# Patient Record
Sex: Female | Born: 1945 | Race: White | Hispanic: No | Marital: Married | Smoking: Never smoker
Health system: Southern US, Community
[De-identification: ages and names within clinical notes are randomized; demographics above are authoritative.]

## PROBLEM LIST (undated history)

## (undated) DIAGNOSIS — I10 Essential (primary) hypertension: Secondary | ICD-10-CM

## (undated) DIAGNOSIS — K219 Gastro-esophageal reflux disease without esophagitis: Secondary | ICD-10-CM

## (undated) DIAGNOSIS — E079 Disorder of thyroid, unspecified: Secondary | ICD-10-CM

## (undated) DIAGNOSIS — F329 Major depressive disorder, single episode, unspecified: Secondary | ICD-10-CM

## (undated) DIAGNOSIS — F32A Depression, unspecified: Secondary | ICD-10-CM

## (undated) DIAGNOSIS — R32 Unspecified urinary incontinence: Secondary | ICD-10-CM

## (undated) HISTORY — PX: PAROTIDECTOMY: SUR1003

## (undated) HISTORY — PX: ABDOMINAL HYSTERECTOMY: SHX81

---

## 2015-02-01 ENCOUNTER — Encounter (HOSPITAL_BASED_OUTPATIENT_CLINIC_OR_DEPARTMENT_OTHER): Payer: Self-pay | Admitting: *Deleted

## 2015-02-01 ENCOUNTER — Emergency Department (HOSPITAL_BASED_OUTPATIENT_CLINIC_OR_DEPARTMENT_OTHER): Payer: BLUE CROSS/BLUE SHIELD

## 2015-02-01 ENCOUNTER — Emergency Department (HOSPITAL_BASED_OUTPATIENT_CLINIC_OR_DEPARTMENT_OTHER)
Admission: EM | Admit: 2015-02-01 | Discharge: 2015-02-01 | Disposition: A | Discharge status: Home / Self Care | Payer: BLUE CROSS/BLUE SHIELD | Attending: Emergency Medicine | Admitting: Emergency Medicine

## 2015-02-01 DIAGNOSIS — Z8719 Personal history of other diseases of the digestive system: Secondary | ICD-10-CM | POA: Diagnosis not present

## 2015-02-01 DIAGNOSIS — Y998 Other external cause status: Secondary | ICD-10-CM | POA: Diagnosis not present

## 2015-02-01 DIAGNOSIS — Z79899 Other long term (current) drug therapy: Secondary | ICD-10-CM | POA: Insufficient documentation

## 2015-02-01 DIAGNOSIS — Z7982 Long term (current) use of aspirin: Secondary | ICD-10-CM | POA: Insufficient documentation

## 2015-02-01 DIAGNOSIS — Y9289 Other specified places as the place of occurrence of the external cause: Secondary | ICD-10-CM | POA: Insufficient documentation

## 2015-02-01 DIAGNOSIS — F329 Major depressive disorder, single episode, unspecified: Secondary | ICD-10-CM | POA: Diagnosis not present

## 2015-02-01 DIAGNOSIS — Y9389 Activity, other specified: Secondary | ICD-10-CM | POA: Diagnosis not present

## 2015-02-01 DIAGNOSIS — E079 Disorder of thyroid, unspecified: Secondary | ICD-10-CM | POA: Diagnosis not present

## 2015-02-01 DIAGNOSIS — I10 Essential (primary) hypertension: Secondary | ICD-10-CM | POA: Insufficient documentation

## 2015-02-01 DIAGNOSIS — S4991XA Unspecified injury of right shoulder and upper arm, initial encounter: Secondary | ICD-10-CM | POA: Diagnosis present

## 2015-02-01 DIAGNOSIS — S42291A Other displaced fracture of upper end of right humerus, initial encounter for closed fracture: Secondary | ICD-10-CM | POA: Insufficient documentation

## 2015-02-01 DIAGNOSIS — W010XXA Fall on same level from slipping, tripping and stumbling without subsequent striking against object, initial encounter: Secondary | ICD-10-CM | POA: Insufficient documentation

## 2015-02-01 HISTORY — DX: Gastro-esophageal reflux disease without esophagitis: K21.9

## 2015-02-01 HISTORY — DX: Essential (primary) hypertension: I10

## 2015-02-01 HISTORY — DX: Depression, unspecified: F32.A

## 2015-02-01 HISTORY — DX: Disorder of thyroid, unspecified: E07.9

## 2015-02-01 HISTORY — DX: Major depressive disorder, single episode, unspecified: F32.9

## 2015-02-01 HISTORY — DX: Unspecified urinary incontinence: R32

## 2015-02-01 NOTE — ED Provider Notes (Addendum)
CSN: [id number]     Arrival date & time 02/01/15  2143 History  This chart was scribed for Paula Libra, MD by Richarda Overlie, ED Scribe. This patient was seen in room MH10/MH10 and the patient's care was started 11:00 PM.   Chief Complaint  Patient presents with  . Arm Injury   The history is provided by the patient. No language interpreter was used.   HPI Comments: Tammy Stokes is a 69 y.o. female who presents to the Emergency Department complaining of right shoulder pain following a fall at approximately 9PM tonight. Pt states she tripped and fell over a PVC pipe, landing on her right upper arm. She denies head trauma or LOC. Pt complains of right shoulder and right upper arm pain at this time and says that her shoulder pain is more severe. She rates her pain as a 4/10 at this time. Pt states that if she were to move her arm her pain would be a 6/10. She denies neck pain or any numbness or weakness in her right upper extremity distally.   Past Medical History  Diagnosis Date  . Hypertension   . GERD (gastroesophageal reflux disease)   . Thyroid disease   . Depression   . Urinary incontinence    Past Surgical History  Procedure Laterality Date  . Abdominal hysterectomy    . Parotidectomy     No family history on file. History  Substance Use Topics  . Smoking status: Never Smoker   . Smokeless tobacco: Not on file  . Alcohol Use: No   OB History    No data available     Review of Systems  Musculoskeletal: Positive for arthralgias. Negative for neck pain.  Neurological: Negative for numbness.  All other systems reviewed and are negative.   Allergies  Review of patient's allergies indicates no known allergies.  Home Medications   Prior to Admission medications   Medication Sig Start Date End Date Taking? Authorizing Provider  Aspirin (ASPIR-81 PO) Take by mouth.   Yes Historical Provider, MD  Atorvastatin Calcium (LIPITOR PO) Take by mouth.   Yes Historical Provider, MD   Darifenacin Hydrobromide (ENABLEX PO) Take by mouth.   Yes Historical Provider, MD  Levothyroxine Sodium (SYNTHROID PO) Take by mouth.   Yes Historical Provider, MD  OMEPRAZOLE PO Take by mouth.   Yes Historical Provider, MD  OxyCODONE HCl (OXYCONTIN PO) Take by mouth.   Yes Historical Provider, MD  PROPRANOLOL HCL PO Take by mouth.   Yes Historical Provider, MD  Sertraline HCl (ZOLOFT PO) Take by mouth.   Yes Historical Provider, MD   BP 149/98 mmHg  Pulse 87  Resp 18  SpO2 98%   Physical Exam  General: Well-developed, well-nourished female in no acute distress; appearance consistent with age of record HENT: normocephalic; atraumatic Eyes: pupils equal, round and reactive to light; extraocular muscles intact; lens implants Neck: supple, non-tender Heart: regular rate and rhythm Lungs: clear to auscultation bilaterally Abdomen: soft; nondistended; nontender; bowel sounds present Extremities: Tenderness and swelling of right shoulder; no tenderness of right elbow; right upper extremity distally neurovascularly intact; no deformity; full range of motion except right shoulder; pulses normal Neurologic: Awake, alert and oriented; motor function intact in all extremities and symmetric; no facial droop Skin: Warm and dry Psychiatric: Normal mood and affect   ED Course  Procedures     COORDINATION OF CARE: 11:10 PM Discussed treatment plan with pt at bedside and pt agreed to plan.  MDM  Nursing notes and vitals signs, including pulse oximetry, reviewed.  Summary of this visit's results, reviewed by myself:  Imaging Studies: Dg Elbow Complete Right  02/01/2015   CLINICAL DATA:  Tripped and fell. Injury to the right elbow with pain.  EXAM: RIGHT ELBOW - COMPLETE 3+ VIEW  COMPARISON:  Right humerus 02/01/2015  FINDINGS: Lateral view is limited due to its oblique orientation. No evidence for a large joint effusion. Negative for a fracture or dislocation.  IMPRESSION: No acute bone  abnormality to the right elbow.   Electronically Signed   By: Richarda OverlieAdam  Henn M.D.   On: 02/01/2015 22:31   Dg Humerus Right  02/01/2015   CLINICAL DATA:  Tripped and fell. Limited range of motion. Injured RIGHT humerus.  EXAM: RIGHT HUMERUS - 2+ VIEW  COMPARISON:  None.  FINDINGS: There is a minimally displaced fracture across the neck of the humerus, slight comminution medially. Fracture line may extend as far cephalad as the greater tuberosity. There is no glenohumeral dislocation. The bones are demineralized. Soft tissue swelling.  IMPRESSION: Minimally displaced humeral neck fracture.  Osteopenia.   Electronically Signed   By: Davonna BellingJohn  Curnes M.D.   On: 02/01/2015 22:32   The patient states she has an adequate supply of analgesics at home. She is on OxyContin for chronic pain.   Final diagnoses:  Humeral head fracture, right, closed, initial encounter   I personally performed the services described in this documentation, which was scribed in my presence. The recorded information has been reviewed and is accurate.   Paula LibraJohn Daxen Lanum, MD 02/01/15 [id number]  Paula LibraJohn Gwenn Teodoro, MD 02/01/15 [phone]

## 2015-02-01 NOTE — ED Notes (Signed)
Tripped and fell tonight. Pain to her right upper arm and elbow.

## 2016-09-20 ENCOUNTER — Encounter (HOSPITAL_BASED_OUTPATIENT_CLINIC_OR_DEPARTMENT_OTHER): Payer: Self-pay | Admitting: *Deleted

## 2016-09-20 ENCOUNTER — Emergency Department (HOSPITAL_BASED_OUTPATIENT_CLINIC_OR_DEPARTMENT_OTHER)
Admission: EM | Admit: 2016-09-20 | Discharge: 2016-09-20 | Disposition: A | Discharge status: Home / Self Care | Payer: Medicare Other | Attending: Emergency Medicine | Admitting: Emergency Medicine

## 2016-09-20 ENCOUNTER — Emergency Department (HOSPITAL_BASED_OUTPATIENT_CLINIC_OR_DEPARTMENT_OTHER): Payer: Medicare Other

## 2016-09-20 DIAGNOSIS — R112 Nausea with vomiting, unspecified: Secondary | ICD-10-CM | POA: Insufficient documentation

## 2016-09-20 DIAGNOSIS — I10 Essential (primary) hypertension: Secondary | ICD-10-CM | POA: Insufficient documentation

## 2016-09-20 DIAGNOSIS — M7989 Other specified soft tissue disorders: Secondary | ICD-10-CM | POA: Diagnosis not present

## 2016-09-20 DIAGNOSIS — R0981 Nasal congestion: Secondary | ICD-10-CM | POA: Diagnosis not present

## 2016-09-20 DIAGNOSIS — R42 Dizziness and giddiness: Secondary | ICD-10-CM | POA: Diagnosis not present

## 2016-09-20 DIAGNOSIS — M549 Dorsalgia, unspecified: Secondary | ICD-10-CM | POA: Insufficient documentation

## 2016-09-20 DIAGNOSIS — R197 Diarrhea, unspecified: Secondary | ICD-10-CM | POA: Diagnosis not present

## 2016-09-20 DIAGNOSIS — Z79899 Other long term (current) drug therapy: Secondary | ICD-10-CM | POA: Diagnosis not present

## 2016-09-20 DIAGNOSIS — R05 Cough: Secondary | ICD-10-CM | POA: Insufficient documentation

## 2016-09-20 DIAGNOSIS — R509 Fever, unspecified: Secondary | ICD-10-CM | POA: Insufficient documentation

## 2016-09-20 DIAGNOSIS — R6889 Other general symptoms and signs: Secondary | ICD-10-CM

## 2016-09-20 DIAGNOSIS — Z7982 Long term (current) use of aspirin: Secondary | ICD-10-CM | POA: Insufficient documentation

## 2016-09-20 LAB — CBC
HCT: 39 % (ref 36.0–46.0)
Hemoglobin: 13.1 g/dL (ref 12.0–15.0)
MCH: 29.1 pg (ref 26.0–34.0)
MCHC: 33.6 g/dL (ref 30.0–36.0)
MCV: 86.7 fL (ref 78.0–100.0)
Platelets: 126 10*3/uL — ABNORMAL LOW (ref 150–400)
RBC: 4.5 MIL/uL (ref 3.87–5.11)
RDW: 14.6 % (ref 11.5–15.5)
WBC: 3.8 10*3/uL — ABNORMAL LOW (ref 4.0–10.5)

## 2016-09-20 LAB — BASIC METABOLIC PANEL
Anion gap: 8 (ref 5–15)
BUN: 16 mg/dL (ref 6–20)
CALCIUM: 8.9 mg/dL (ref 8.9–10.3)
CHLORIDE: 102 mmol/L (ref 101–111)
CO2: 25 mmol/L (ref 22–32)
CREATININE: 0.75 mg/dL (ref 0.44–1.00)
GFR calc Af Amer: >60 mL/min (ref >60)
GFR calc non Af Amer: >60 mL/min (ref >60)
Glucose, Bld: 119 mg/dL — ABNORMAL HIGH (ref 65–99)
Potassium: 3.5 mmol/L (ref 3.5–5.1)
Sodium: 135 mmol/L (ref 135–145)

## 2016-09-20 MED ORDER — DM-GUAIFENESIN ER 30-600 MG PO TB12: 1.0000 | ORAL_TABLET | Freq: Two times a day (BID) | ORAL | 0 refills | Status: AC

## 2016-09-20 MED ORDER — ACETAMINOPHEN 325 MG PO TABS
650.0000 mg | ORAL_TABLET | Freq: Once | ORAL | Status: AC
  Administered 2016-09-20: 650 mg via ORAL

## 2016-09-20 MED ORDER — OSELTAMIVIR PHOSPHATE 75 MG PO CAPS
75.0000 mg | ORAL_CAPSULE | Freq: Once | ORAL | Status: AC
  Administered 2016-09-20: 75 mg via ORAL
  Filled 2016-09-20: qty 1

## 2016-09-20 MED ORDER — ONDANSETRON 4 MG PO TBDP: 4.0000 mg | ORAL_TABLET | Freq: Three times a day (TID) | ORAL | 0 refills | Status: AC | PRN

## 2016-09-20 MED ORDER — OSELTAMIVIR PHOSPHATE 75 MG PO CAPS: 75.0000 mg | ORAL_CAPSULE | Freq: Two times a day (BID) | ORAL | 0 refills | Status: AC

## 2016-09-20 MED ORDER — SODIUM CHLORIDE 0.9 % IV BOLUS (SEPSIS)
500.0000 mL | Freq: Once | INTRAVENOUS | Status: AC
  Administered 2016-09-20: 500 mL via INTRAVENOUS

## 2016-09-20 MED ORDER — ONDANSETRON HCL 4 MG/2ML IJ SOLN
4.0000 mg | Freq: Once | INTRAMUSCULAR | Status: AC
  Administered 2016-09-20: 4 mg via INTRAVENOUS
  Filled 2016-09-20: qty 2

## 2016-09-20 MED ORDER — SODIUM CHLORIDE 0.9 % IV SOLN
INTRAVENOUS | Status: DC
  Administered 2016-09-20: 21:00:00 via INTRAVENOUS

## 2016-09-20 MED ORDER — ACETAMINOPHEN 325 MG PO TABS
ORAL_TABLET | ORAL | Status: AC
  Filled 2016-09-20: qty 2

## 2016-09-20 NOTE — Discharge Instructions (Signed)
Take the Zofran for the nausea and vomiting. Take the Tamiflu as directed. First dose given here tonight. Return for any new or worse symptoms. If cough and congestion gets worse have a prescription for Mucinex DM. Follow-up with your doctor for recheck.

## 2016-09-20 NOTE — ED Triage Notes (Signed)
Headache, vomiting, fever, dizziness. She started with a cold yesterday.

## 2016-09-20 NOTE — ED Notes (Signed)
Pt. Has vomited x 2 or 3 today and has felt like not eating.  Pt. Reports she has been going to rehab to get her balance back on track due to imbalance problems.  Pt. 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

## 2016-09-20 NOTE — ED Provider Notes (Signed)
[facility] Provider Note   CSN: [id number] Arrival date & time: 09/20/16  1851 By signing my name below, I, Tammy Stokes, attest that this documentation has been prepared under the direction and in the presence of Tammy Mulders, MD . Electronically Signed: Levon Stokes, Scribe. 09/20/2016. 8:38 PM.   History   Chief Complaint Chief Complaint  Patient presents with  . Influenza   HPI Tammy Stokes is a 71 y.o. female with a history of HTN and thyroid disease who presents to the Emergency Department complaining of intermittent, moderate vomiting 3x which began today. Pt notes associated congestion, cough, fever, chills, nausea, ankle swelling, and dizziness. No alleviating or modifying factors noted.  No prior treatments indicated. Pt also endorses dizziness and back pain which she states is baseline for her. 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 Pt's husband tested positive for flu yesterday. Pt denies any rhinorrhea, sore throat, visual disturbance, SOB, CP, diarrhea, abdominal pain, dysuria, hematuria, or headaches.    The history is provided by the patient. No language interpreter was used.   Past Medical History:  Diagnosis Date  . Depression   . GERD (gastroesophageal reflux disease)   . Hypertension   . Thyroid disease   . Urinary incontinence    There are no active problems to display for this patient.   Past Surgical History:  Procedure Laterality Date  . ABDOMINAL HYSTERECTOMY    . PAROTIDECTOMY      OB History    No data available      Home Medications    Prior to Admission medications   Medication Sig Start Date End Date Taking? Authorizing Provider  Aspirin (ASPIR-81 PO) Take by mouth.   Yes Historical Provider, MD  Atorvastatin Calcium (LIPITOR PO) Take by mouth.   Yes Historical Provider, MD    Levothyroxine Sodium (SYNTHROID PO) Take by mouth.   Yes Historical Provider, MD  OMEPRAZOLE PO Take by mouth.   Yes Historical Provider, MD  PROPRANOLOL HCL PO Take by mouth.   Yes Historical Provider, MD  Sertraline HCl (ZOLOFT PO) Take by mouth.   Yes Historical Provider, MD  Darifenacin Hydrobromide (ENABLEX PO) Take by mouth.    Historical Provider, MD  dextromethorphan-guaiFENesin (MUCINEX DM) 30-600 MG 12hr tablet Take 1 tablet by mouth 2 (two) times daily. 09/20/16   Tammy Mulders, MD  ondansetron (ZOFRAN ODT) 4 MG disintegrating tablet Take 1 tablet (4 mg total) by mouth every 8 (eight) hours as needed for nausea or vomiting. 09/20/16   Tammy Mulders, MD  oseltamivir (TAMIFLU) 75 MG capsule Take 1 capsule (75 mg total) by mouth every 12 (twelve) hours. 09/20/16   Tammy Mulders, MD  OxyCODONE HCl (OXYCONTIN PO) Take by mouth.    Historical Provider, MD    Family History No family history on file.  Social History Social History  Substance Use Topics  . Smoking status: Never Smoker  . Smokeless tobacco: Never Used  . Alcohol use No     Allergies   Patient has no known allergies.   Review of Systems Review of Systems  Constitutional: Negative for chills and fever.  HENT: Positive for congestion. Negative for rhinorrhea, sneezing and sore throat.   Eyes: Negative for visual disturbance.  Respiratory: Positive for cough. Negative for shortness of breath.   Cardiovascular: Negative for chest pain.  Gastrointestinal: Positive  for diarrhea and nausea. Negative for abdominal pain and vomiting.  Genitourinary: Negative for dysuria and hematuria.  Musculoskeletal: Positive for back pain and joint swelling.  Skin: Negative for rash.  Neurological: Positive for dizziness. Negative for headaches.  Hematological: Does not bruise/bleed easily.  Psychiatric/Behavioral: Negative for confusion.   Physical Exam Updated Vital Signs BP 122/71 (BP Location: Right Arm)   Pulse 94    Temp 99.3 F (37.4 C) (Oral)   Resp 18   Ht 5\' 6"  (1.676 m)   Wt 74.8 kg   SpO2 99%   BMI 26.63 kg/m   Physical Exam  Constitutional: She is oriented to person, place, and time. She appears well-developed and well-nourished. No distress.  HENT:  Head: Normocephalic and atraumatic.  Moist mucus membranes  Eyes: Conjunctivae and EOM are normal. Pupils are equal, round, and reactive to light. No scleral icterus.  Cardiovascular: Normal rate and regular rhythm.   Pulmonary/Chest: Effort normal. No respiratory distress. She has no wheezes. She has no rales. She exhibits no tenderness.  RA stats 98%  Abdominal: She exhibits no distension. There is no tenderness.  Musculoskeletal: She exhibits no edema.  Neurological: She is alert and oriented to person, place, and time.  Skin: Skin is warm and dry.  Psychiatric: She has a normal mood and affect.  Nursing note and vitals reviewed.  ED Treatments / Results  DIAGNOSTIC STUDIES:  Oxygen Saturation is 100% on RA, normal by my interpretation.    COORDINATION OF CARE:  8:34 PM Discussed treatment plan with pt at bedside and pt agreed to plan.   Labs (all labs ordered are listed, but only abnormal results are displayed) Labs Reviewed  CBC - Abnormal; Notable for the following:       Result Value   WBC 3.8 (*)    Platelets 126 (*)    All other components within normal limits  BASIC METABOLIC PANEL - Abnormal; Notable for the following:    Glucose, Bld 119 (*)    All other components within normal limits    EKG  EKG Interpretation None       Radiology Dg Chest 2 View  Result Date: 09/20/2016 CLINICAL DATA:  Cough and fever for the past 2 days. EXAM: CHEST  2 VIEW COMPARISON:  None. FINDINGS: Normal sized heart. Clear lungs. Mild diffuse peribronchial thickening. Unremarkable bones. IMPRESSION: Mild bronchitic changes. Electronically Signed   By: Beckie SaltsSteven  Reid M.D.   On: 09/20/2016 21:02    Procedures Procedures (including  critical care time)  Medications Ordered in ED Medications  0.9 %  sodium chloride infusion ( Intravenous Stopped 09/20/16 2232)  acetaminophen (TYLENOL) 325 MG tablet (not administered)  sodium chloride 0.9 % bolus 500 mL (0 mLs Intravenous Stopped 09/20/16 2118)  ondansetron (ZOFRAN) injection 4 mg (4 mg Intravenous Given 09/20/16 2053)  oseltamivir (TAMIFLU) capsule 75 mg (75 mg Oral Given 09/20/16 2125)  acetaminophen (TYLENOL) tablet 650 mg (650 mg Oral Given 09/20/16 2125)     Initial Impression / Assessment and Plan / ED Course  I have reviewed the triage vital signs and the nursing notes.  Pertinent labs & imaging results that were available during my care of the patient were reviewed by me and considered in my medical decision making (see chart for details).     Patient symptoms consistent with flu like illness. Husband with similar symptoms. He was started on Tamiflu yesterday. Patient symptoms just started yesterday. Main symptoms have been body aches nausea and vomiting and the  feeling of fever. Patient improved here with IV fluids. Labs without significant abnormalities. Chest x-ray negative for pneumonia. Patient we treated with Zofran for the nausea and vomiting Tamiflu first dose given here tonight. And Mucinex DM if the cough and congestion gets worse.  Patient has a history of right parotid duct cancer first diagnosed in 2016. Went through radiation chemotherapy and a selective resection. Patient had follow-up this recently with radiation oncology beginning of the month without any evidence of any recurrent disease.  Final Clinical Impressions(s) / ED Diagnoses   Final diagnoses:  Flu-like symptoms    New Prescriptions New Prescriptions   DEXTROMETHORPHAN-GUAIFENESIN (MUCINEX DM) 30-600 MG 12HR TABLET    Take 1 tablet by mouth 2 (two) times daily.   ONDANSETRON (ZOFRAN ODT) 4 MG DISINTEGRATING TABLET    Take 1 tablet (4 mg total) by mouth every 8 (eight) hours as needed  for nausea or vomiting.   OSELTAMIVIR (TAMIFLU) 75 MG CAPSULE    Take 1 capsule (75 mg total) by mouth every 12 (twelve) hours.  I personally performed the services described in this documentation, which was scribed in my presence. The recorded information has been reviewed and is accurate.      Tammy Mulders, MD 09/20/16 2232

## 2018-09-15 ENCOUNTER — Other Ambulatory Visit: Payer: Self-pay

## 2018-09-15 ENCOUNTER — Emergency Department (HOSPITAL_BASED_OUTPATIENT_CLINIC_OR_DEPARTMENT_OTHER): Payer: Medicare Other

## 2018-09-15 ENCOUNTER — Encounter (HOSPITAL_BASED_OUTPATIENT_CLINIC_OR_DEPARTMENT_OTHER): Payer: Self-pay | Admitting: Emergency Medicine

## 2018-09-15 ENCOUNTER — Emergency Department (HOSPITAL_BASED_OUTPATIENT_CLINIC_OR_DEPARTMENT_OTHER)
Admission: EM | Admit: 2018-09-15 | Discharge: 2018-09-15 | Disposition: A | Discharge status: Home / Self Care | Payer: Medicare Other | Attending: Emergency Medicine | Admitting: Emergency Medicine

## 2018-09-15 DIAGNOSIS — S0990XA Unspecified injury of head, initial encounter: Secondary | ICD-10-CM | POA: Diagnosis not present

## 2018-09-15 DIAGNOSIS — M545 Low back pain, unspecified: Secondary | ICD-10-CM

## 2018-09-15 DIAGNOSIS — Y92009 Unspecified place in unspecified non-institutional (private) residence as the place of occurrence of the external cause: Secondary | ICD-10-CM | POA: Insufficient documentation

## 2018-09-15 DIAGNOSIS — W1830XA Fall on same level, unspecified, initial encounter: Secondary | ICD-10-CM | POA: Insufficient documentation

## 2018-09-15 DIAGNOSIS — R55 Syncope and collapse: Secondary | ICD-10-CM | POA: Diagnosis present

## 2018-09-15 DIAGNOSIS — Y999 Unspecified external cause status: Secondary | ICD-10-CM | POA: Insufficient documentation

## 2018-09-15 DIAGNOSIS — Z7982 Long term (current) use of aspirin: Secondary | ICD-10-CM | POA: Diagnosis not present

## 2018-09-15 DIAGNOSIS — Y9301 Activity, walking, marching and hiking: Secondary | ICD-10-CM | POA: Insufficient documentation

## 2018-09-15 DIAGNOSIS — Z79899 Other long term (current) drug therapy: Secondary | ICD-10-CM | POA: Insufficient documentation

## 2018-09-15 DIAGNOSIS — I10 Essential (primary) hypertension: Secondary | ICD-10-CM | POA: Diagnosis not present

## 2018-09-15 LAB — CBC WITH DIFFERENTIAL/PLATELET
ABS IMMATURE GRANULOCYTES: 0.02 10*3/uL (ref 0.00–0.07)
BASOS PCT: 0 %
Basophils Absolute: 0 10*3/uL (ref 0.0–0.1)
EOS PCT: 1 %
Eosinophils Absolute: 0.1 10*3/uL (ref 0.0–0.5)
HCT: 41.8 % (ref 36.0–46.0)
Hemoglobin: 13 g/dL (ref 12.0–15.0)
Immature Granulocytes: 0 %
Lymphocytes Relative: 14 %
Lymphs Abs: 1 10*3/uL (ref 0.7–4.0)
MCH: 27.3 pg (ref 26.0–34.0)
MCHC: 31.1 g/dL (ref 30.0–36.0)
MCV: 87.8 fL (ref 80.0–100.0)
MONO ABS: 0.7 10*3/uL (ref 0.1–1.0)
MONOS PCT: 11 %
NEUTROS PCT: 74 %
Neutro Abs: 4.9 10*3/uL (ref 1.7–7.7)
PLATELETS: 184 10*3/uL (ref 150–400)
RBC: 4.76 MIL/uL (ref 3.87–5.11)
RDW: 14.8 % (ref 11.5–15.5)
WBC: 6.7 10*3/uL (ref 4.0–10.5)
nRBC: 0 % (ref 0.0–0.2)

## 2018-09-15 LAB — BASIC METABOLIC PANEL
ANION GAP: 4 — AB (ref 5–15)
BUN: 12 mg/dL (ref 8–23)
CO2: 25 mmol/L (ref 22–32)
Calcium: 8.7 mg/dL — ABNORMAL LOW (ref 8.9–10.3)
Chloride: 107 mmol/L (ref 98–111)
Creatinine, Ser: 0.61 mg/dL (ref 0.44–1.00)
GFR calc Af Amer: >60 mL/min (ref >60)
GFR calc non Af Amer: >60 mL/min (ref >60)
Glucose, Bld: 120 mg/dL — ABNORMAL HIGH (ref 70–99)
Potassium: 3.6 mmol/L (ref 3.5–5.1)
Sodium: 136 mmol/L (ref 135–145)

## 2018-09-15 LAB — TROPONIN I: Troponin I: <0.03 ng/mL (ref <0.03)

## 2018-09-15 LAB — CBG MONITORING, ED: Glucose-Capillary: 103 mg/dL — ABNORMAL HIGH (ref 70–99)

## 2018-09-15 MED ORDER — POTASSIUM CHLORIDE 10 MEQ/100ML IV SOLN: 10.0000 meq | INTRAVENOUS | Status: DC

## 2018-09-15 NOTE — ED Notes (Signed)
Patient transported to CT 

## 2018-09-15 NOTE — ED Provider Notes (Signed)
[facility] EMERGENCY DEPARTMENT Provider Note   CSN: [id number] Arrival date & time: 09/15/18  1709     History   Chief Complaint Chief Complaint  Patient presents with  . Fall    HPI Tammy Stokes is a 73 y.o. female.  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  She said this morning she was walking from the kitchen back to the bathroom and then she woke up on the floor.  She thinks she was out for about 15 minutes.  She thinks she might of struck her head going down and she has some lower back and left hip pain.  She was able to get up and go back to bed and slept for a few hours.  She says her neurologic symptoms are at baseline other than some soreness in her back causing her a little more difficulty than normal with walking.  She does not know if she lost her balance and fell and then passed out or if the syncope was primary.  No history of syncope.  No fevers or chills nausea or vomiting.  No urinary symptoms.  No numbness or weakness.  No blurry vision or double vision.  The history is provided by the patient and the spouse.  Fall  This is a new problem. The current episode started 6 to 12 hours ago. The problem has been resolved. Associated symptoms include headaches. Pertinent negatives include no chest pain, no abdominal pain and no shortness of breath. Nothing aggravates the symptoms. Nothing relieves the symptoms. She has tried nothing for the symptoms. The treatment provided no relief.  Loss of Consciousness  Episode history:  Single Most recent episode:  Today Progression:  Resolved Chronicity:  New Context: normal activity   Witnessed: no   Relieved by:  Bed rest Worsened by:  Nothing Ineffective treatments:  None tried Associated symptoms: headaches   Associated symptoms: no chest pain, no difficulty breathing, no fever, no nausea, no palpitations, no rectal bleeding, no seizures, no shortness of breath, no  visual change and no vomiting     Past Medical History:  Diagnosis Date  . Depression   . GERD (gastroesophageal reflux disease)   . Hypertension   . Thyroid disease   . Urinary incontinence     There are no active problems to display for this patient.   Past Surgical History:  Procedure Laterality Date  . ABDOMINAL HYSTERECTOMY    . PAROTIDECTOMY       OB History   No obstetric history on file.      Home Medications    Prior to Admission medications   Medication Sig Start Date End Date Taking? Authorizing Provider  Aspirin (ASPIR-81 PO) Take by mouth.    [provider]  Atorvastatin Calcium (LIPITOR PO) Take by mouth.    [provider]  Darifenacin Hydrobromide (ENABLEX PO) Take by mouth.    [provider]  dextromethorphan-guaiFENesin (MUCINEX DM) 30-600 MG 12hr tablet Take 1 tablet by mouth 2 (two) times daily. 09/20/16   Vanetta Mulders, MD  Levothyroxine Sodium (SYNTHROID PO) Take by mouth.    [provider]  OMEPRAZOLE PO Take by mouth.    [provider]  ondansetron (ZOFRAN ODT) 4 MG disintegrating tablet Take 1 tablet (4 mg total) by mouth every 8 (eight) hours as needed for nausea or vomiting. 09/20/16   Vanetta Mulders, MD  oseltamivir (TAMIFLU) 75 MG capsule Take 1  capsule (75 mg total) by mouth every 12 (twelve) hours. 09/20/16   Vanetta MuldersZackowski, Scott, MD  OxyCODONE HCl (OXYCONTIN PO) Take by mouth.    [provider]  PROPRANOLOL HCL PO Take by mouth.    [provider]  Sertraline HCl (ZOLOFT PO) Take by mouth.    [provider]    Family History History reviewed. No pertinent family history.  Social History Social History   Tobacco Use  . Smoking status: Never Smoker  . Smokeless tobacco: Never Used  Substance Use Topics  . Alcohol use: No  . Drug use: No     Allergies   Gabapentin   Review of Systems Review of Systems  Constitutional: Negative for fever.  HENT:  Positive for hearing loss. Negative for sore throat.   Eyes: Negative for visual disturbance.  Respiratory: Negative for shortness of breath.   Cardiovascular: Positive for syncope. Negative for chest pain and palpitations.  Gastrointestinal: Negative for abdominal pain, nausea and vomiting.  Genitourinary: Negative for dysuria.  Musculoskeletal: Positive for back pain. Negative for neck pain.  Skin: Negative for rash.  Neurological: Positive for headaches. Negative for seizures and speech difficulty.     Physical Exam Updated Vital Signs BP (!) 134/91 (BP Location: Right Arm)   Pulse 87   Temp 97.7 F (36.5 C) (Oral)   Resp 16   Ht 5\' 6"  (1.676 m)   Wt 79.4 kg   SpO2 99%   BMI 28.25 kg/m   Physical Exam Vitals signs and nursing note reviewed.  Constitutional:      General: She is not in acute distress.    Appearance: She is well-developed.  HENT:     Head: Normocephalic and atraumatic.  Eyes:     Conjunctiva/sclera: Conjunctivae normal.  Neck:     Musculoskeletal: Neck supple.  Cardiovascular:     Rate and Rhythm: Normal rate and regular rhythm.     Heart sounds: No murmur.  Pulmonary:     Effort: Pulmonary effort is normal. No respiratory distress.     Breath sounds: Normal breath sounds.  Abdominal:     Palpations: Abdomen is soft.     Tenderness: There is no abdominal tenderness.  Musculoskeletal: Normal range of motion.        General: No deformity.     Comments: She is tenderness of her left paralumbar area and over the iliac wing.  No midline tenderness.  Full range of motion of all joints.  Skin:    General: Skin is warm and dry.     Capillary Refill: Capillary refill takes less than 2 seconds.  Neurological:     General: No focal deficit present.     Mental Status: She is alert and oriented to person, place, and time.     Sensory: No sensory deficit.     Motor: No weakness.      ED Treatments / Results  Labs (all labs ordered are listed, but only  abnormal results are displayed) Labs Reviewed  BASIC METABOLIC PANEL - Abnormal; Notable for the following components:      Result Value   Glucose, Bld 120 (*)    Calcium 8.7 (*)    Anion gap 4 (*)    All other components within normal limits  CBG MONITORING, ED - Abnormal; Notable for the following components:   Glucose-Capillary 103 (*)    All other components within normal limits  CBC WITH DIFFERENTIAL/PLATELET  TROPONIN I    EKG EKG Interpretation  Date/Time:  Monday September 15 2018 18:41:30 EST Ventricular Rate:  81 PR Interval:    QRS Duration: 101 QT Interval:  393 QTC Calculation: 457 R Axis:   56 Text Interpretation:  Sinus rhythm Ventricular premature complex Low voltage, precordial leads no prior to compare with Confirmed by Meridee ScoreButler, Michael [phone]([id number]) on 09/15/2018 6:50:45 PM   Radiology Dg Lumbar Spine Complete  Result Date: 09/15/2018 CLINICAL DATA:  Recent fall with low back pain, initial encounter EXAM: LUMBAR SPINE - COMPLETE 4+ VIEW COMPARISON:  None. FINDINGS: Five lumbar type vertebral bodies are well visualized. Vertebral body height is well maintained. Degenerative changes are noted particularly in the upper lumbar spine. No anterolisthesis is seen. No pars defects are noted. No soft tissue abnormality is seen. IMPRESSION: Degenerative change without acute abnormality. Electronically Signed   By: Alcide CleverMark  Lukens M.D.   On: 09/15/2018 19:21   Ct Head Wo Contrast  Result Date: 09/15/2018 CLINICAL DATA:  73 year old female with history of altered level of consciousness. Unwitnessed fall at home. Head pain. EXAM: CT HEAD WITHOUT CONTRAST CT CERVICAL SPINE WITHOUT CONTRAST TECHNIQUE: Multidetector CT imaging of the head and cervical spine was performed following the standard protocol without intravenous contrast. Multiplanar CT image reconstructions of the cervical spine were also generated. COMPARISON:  CT of the neck with contrast 08/06/2014. FINDINGS: CT HEAD FINDINGS  Brain: Mild cerebral atrophy. Patchy and confluent areas of decreased attenuation are noted throughout the deep and periventricular white matter of the cerebral hemispheres bilaterally, compatible with chronic microvascular ischemic disease. No evidence of acute infarction, hemorrhage, hydrocephalus, extra-axial collection or mass lesion/mass effect. Vascular: No hyperdense vessel or unexpected calcification. Skull: Normal. Negative for fracture or focal lesion. Sinuses/Orbits: No acute finding. Other: Large right mastoid effusion. CT CERVICAL SPINE FINDINGS Alignment: Mild straightening of normal cervical lordosis, particularly at the level of C5-C6, likely chronic related to degenerative disc disease and facet arthropathy. Skull base and vertebrae: No acute fracture. No primary bone lesion or focal pathologic process. Soft tissues and spinal canal: No prevertebral fluid or swelling. No visible canal hematoma. Disc levels: Multilevel degenerative disc disease, most severe at C5-C6. Moderate multilevel facet arthropathy. Upper chest: Visualized portions are unremarkable. Other: Large right mastoid effusion. IMPRESSION: 1. No evidence of significant acute traumatic injury to the skull, brain or cervical spine. 2. Large right mastoid effusion. Clinical correlation for evidence of mastoiditis is recommended. 3. Mild cerebral atrophy with chronic microvascular ischemic changes in the cerebral white matter, as above. 4. Multilevel degenerative disc disease and cervical spondylosis, as above. Electronically Signed   By: Trudie Reedaniel  Entrikin M.D.   On: 09/15/2018 19:10   Ct Cervical Spine Wo Contrast  Result Date: 09/15/2018 CLINICAL DATA:  73 year old female with history of altered level of consciousness. Unwitnessed fall at home. Head pain. EXAM: CT HEAD WITHOUT CONTRAST CT CERVICAL SPINE WITHOUT CONTRAST TECHNIQUE: Multidetector CT imaging of the head and cervical spine was performed following the standard protocol  without intravenous contrast. Multiplanar CT image reconstructions of the cervical spine were also generated. COMPARISON:  CT of the neck with contrast 08/06/2014. FINDINGS: CT HEAD FINDINGS Brain: Mild cerebral atrophy. Patchy and confluent areas of decreased attenuation are noted throughout the deep and periventricular white matter of the cerebral hemispheres bilaterally, compatible with chronic microvascular ischemic disease. No evidence of acute infarction, hemorrhage, hydrocephalus, extra-axial collection or mass lesion/mass effect. Vascular: No hyperdense vessel or unexpected calcification. Skull: Normal. Negative for fracture or focal lesion. Sinuses/Orbits: No acute finding. Other: Large right mastoid  effusion. CT CERVICAL SPINE FINDINGS Alignment: Mild straightening of normal cervical lordosis, particularly at the level of C5-C6, likely chronic related to degenerative disc disease and facet arthropathy. Skull base and vertebrae: No acute fracture. No primary bone lesion or focal pathologic process. Soft tissues and spinal canal: No prevertebral fluid or swelling. No visible canal hematoma. Disc levels: Multilevel degenerative disc disease, most severe at C5-C6. Moderate multilevel facet arthropathy. Upper chest: Visualized portions are unremarkable. Other: Large right mastoid effusion. IMPRESSION: 1. No evidence of significant acute traumatic injury to the skull, brain or cervical spine. 2. Large right mastoid effusion. Clinical correlation for evidence of mastoiditis is recommended. 3. Mild cerebral atrophy with chronic microvascular ischemic changes in the cerebral white matter, as above. 4. Multilevel degenerative disc disease and cervical spondylosis, as above. Electronically Signed   By: Trudie Reed M.D.   On: 09/15/2018 19:10   Dg Hip Unilat With Pelvis 2-3 Views Left  Result Date: 09/15/2018 CLINICAL DATA:  Recent fall with left hip pain, initial encounter EXAM: DG HIP (WITH OR WITHOUT  PELVIS) 3V LEFT COMPARISON:  None. FINDINGS: Left hip prosthesis is noted and well seated. No acute fracture or dislocation is noted. No soft tissue abnormality is seen. IMPRESSION: Status post left hip prosthesis.  No acute abnormality noted. Electronically Signed   By: Alcide Clever M.D.   On: 09/15/2018 19:22    Procedures Procedures (including critical care time)  Medications Ordered in ED Medications - No data to display   Initial Impression / Assessment and Plan / ED Course  I have reviewed the triage vital signs and the nursing notes.  Pertinent labs & imaging results that were available during my care of the patient were reviewed by me and considered in my medical decision making (see chart for details).  Clinical Course as of Sep 15 2342  Mon Sep 15, 2018  1802 EKG is normal sinus rhythm rate of 73 normal intervals nonspecific ST-T changes.   [MB]  1945 Patient's work-up is been fairly unremarkable.  She did have an abnormality on the head CT but she has an appointment with her ENT on Wednesday which I think would be reasonable.  She would like to go home and I think that is okay although she understands she needs to keep her ENT appointment and review the findings along with talking to her primary care doctor about this possible syncopal event.   [MB]  1946 The finding in the head CT shows a right mastoid effusion.  Clinically on exam she is got no mastoid tenderness and no overlying erythema.   [MB]    Clinical Course User Index [MB] Terrilee Files, MD     Final Clinical Impressions(s) / ED Diagnoses   Final diagnoses:  Syncope and collapse  Minor head injury, initial encounter  Acute left-sided low back pain without sciatica    ED Discharge Orders    None       Terrilee Files, MD 09/15/18 2345

## 2018-09-15 NOTE — ED Triage Notes (Signed)
Reports unwitnessed fall at this home this morning.  Reports she does not remember fall or events leading up to the fall.  Reports head pain.  Denies use of blood thinners.

## 2018-09-15 NOTE — Discharge Instructions (Addendum)
Were seen in the emergency department for a possible fainting spell along with head pain and low back pain.  You had CAT scan and x-rays along with blood work that did not show an obvious cause of your fainting spell.  There was a finding on the head CT that you will need to review with your ear nose throat doctor.  Please continue your regular medications and stay well-hydrated.  Make a follow-up appointment with your primary care doctor because they may set you up for some further testing.  Included below is the report of the CT head.  IMPRESSION:  1. No evidence of significant acute traumatic injury to the skull,  brain or cervical spine.  2. Large right mastoid effusion. Clinical correlation for evidence  of mastoiditis is recommended.  3. Mild cerebral atrophy with chronic microvascular ischemic changes  in the cerebral white matter, as above.  4. Multilevel degenerative disc disease and cervical spondylosis, as  above.

## 2019-07-21 IMAGING — DX DG HIP (WITH OR WITHOUT PELVIS) 2-3V*L*
3 series · 3 of 3 positions shown · non-contrast
Comparison: None.

CLINICAL DATA: Recent fall with left hip pain, initial encounter

EXAM:
DG HIP (WITH OR WITHOUT PELVIS) 3V LEFT

[pelvis ap]
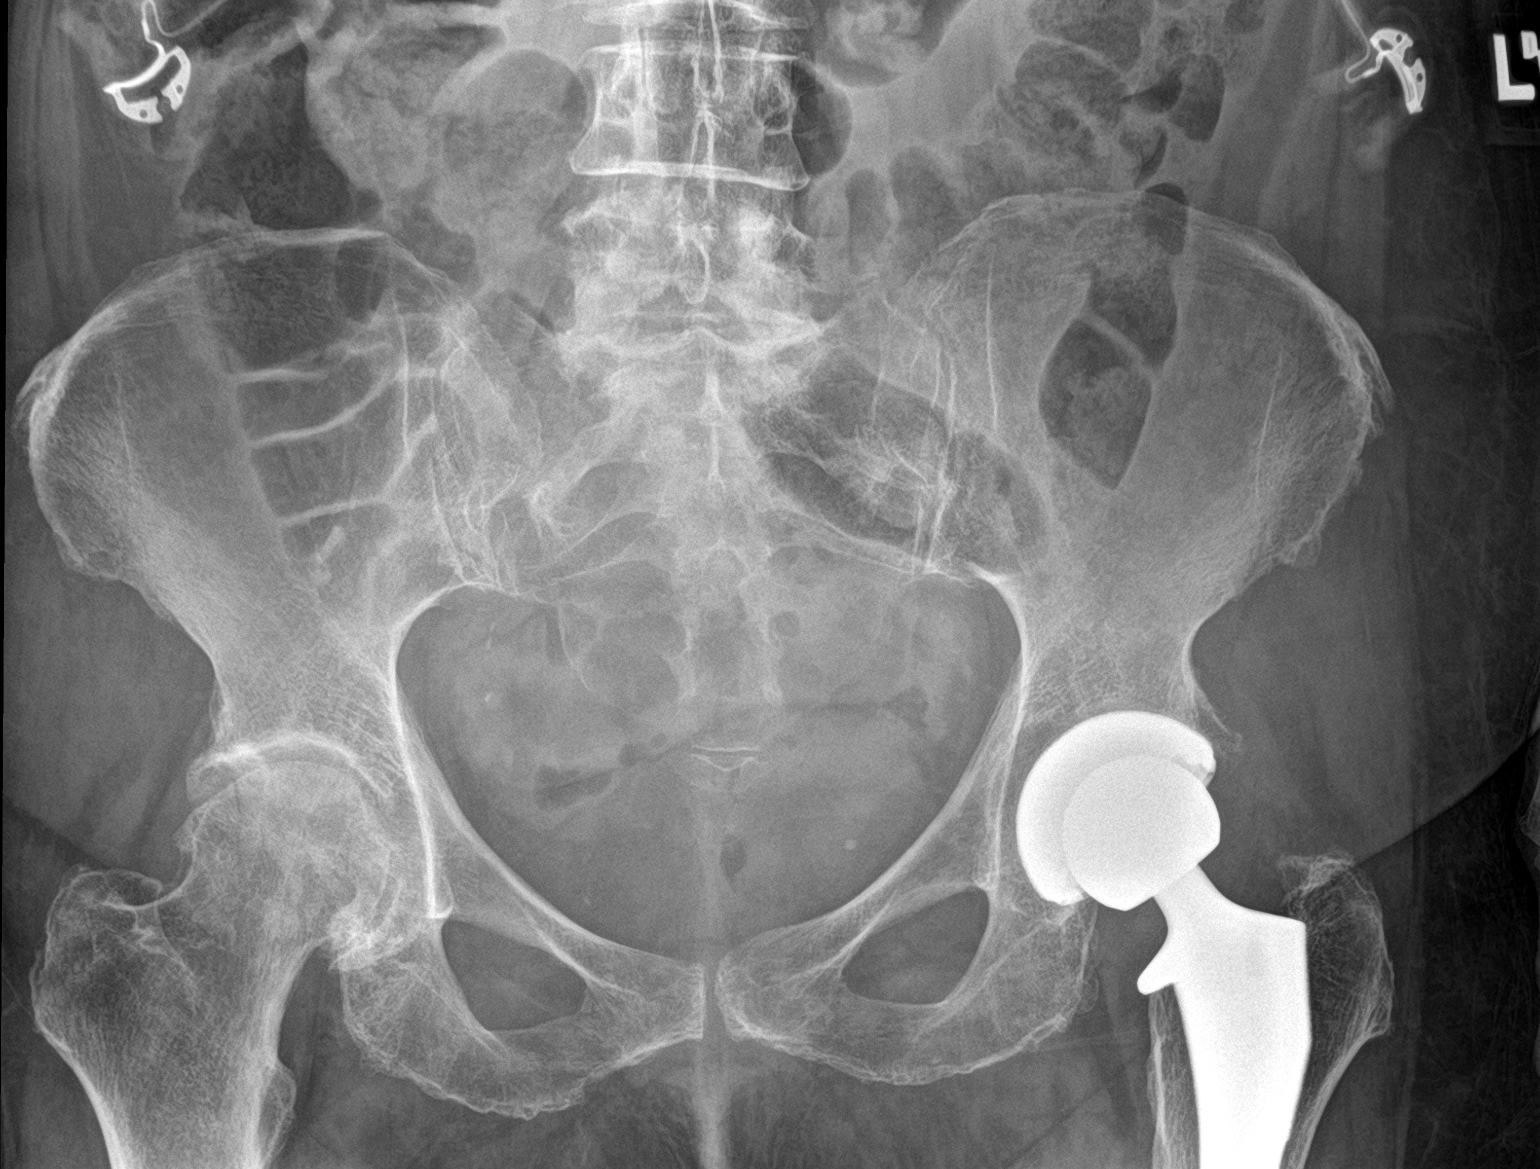

[hip ap]
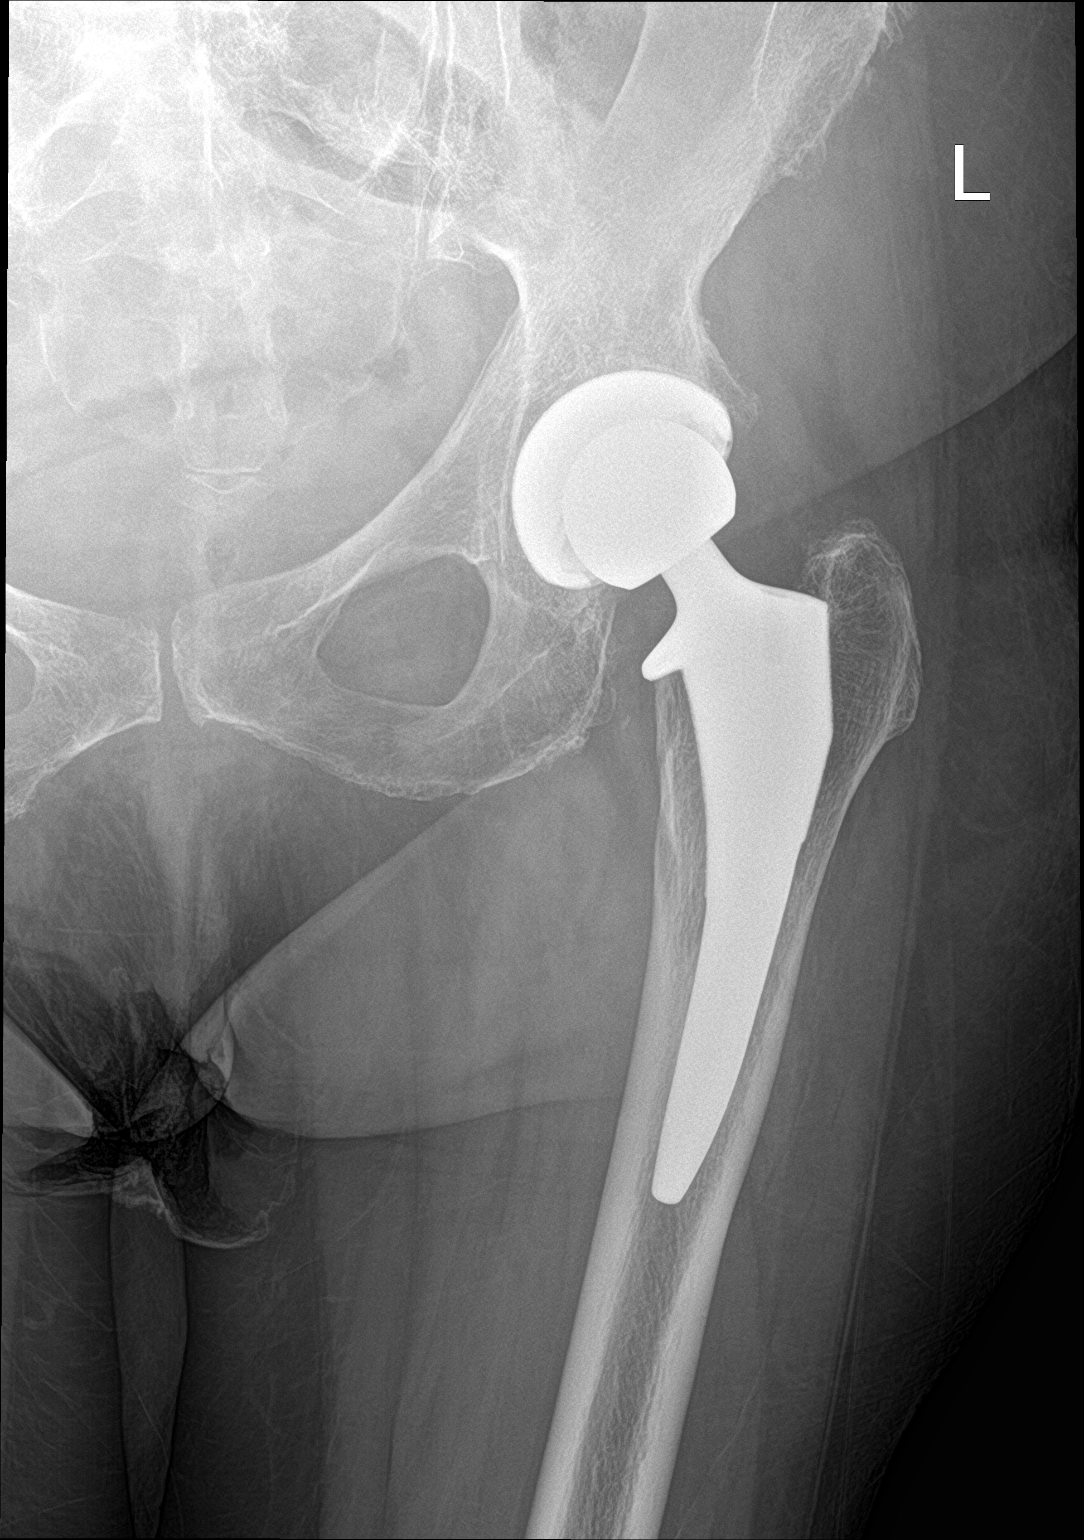

[hip lat]
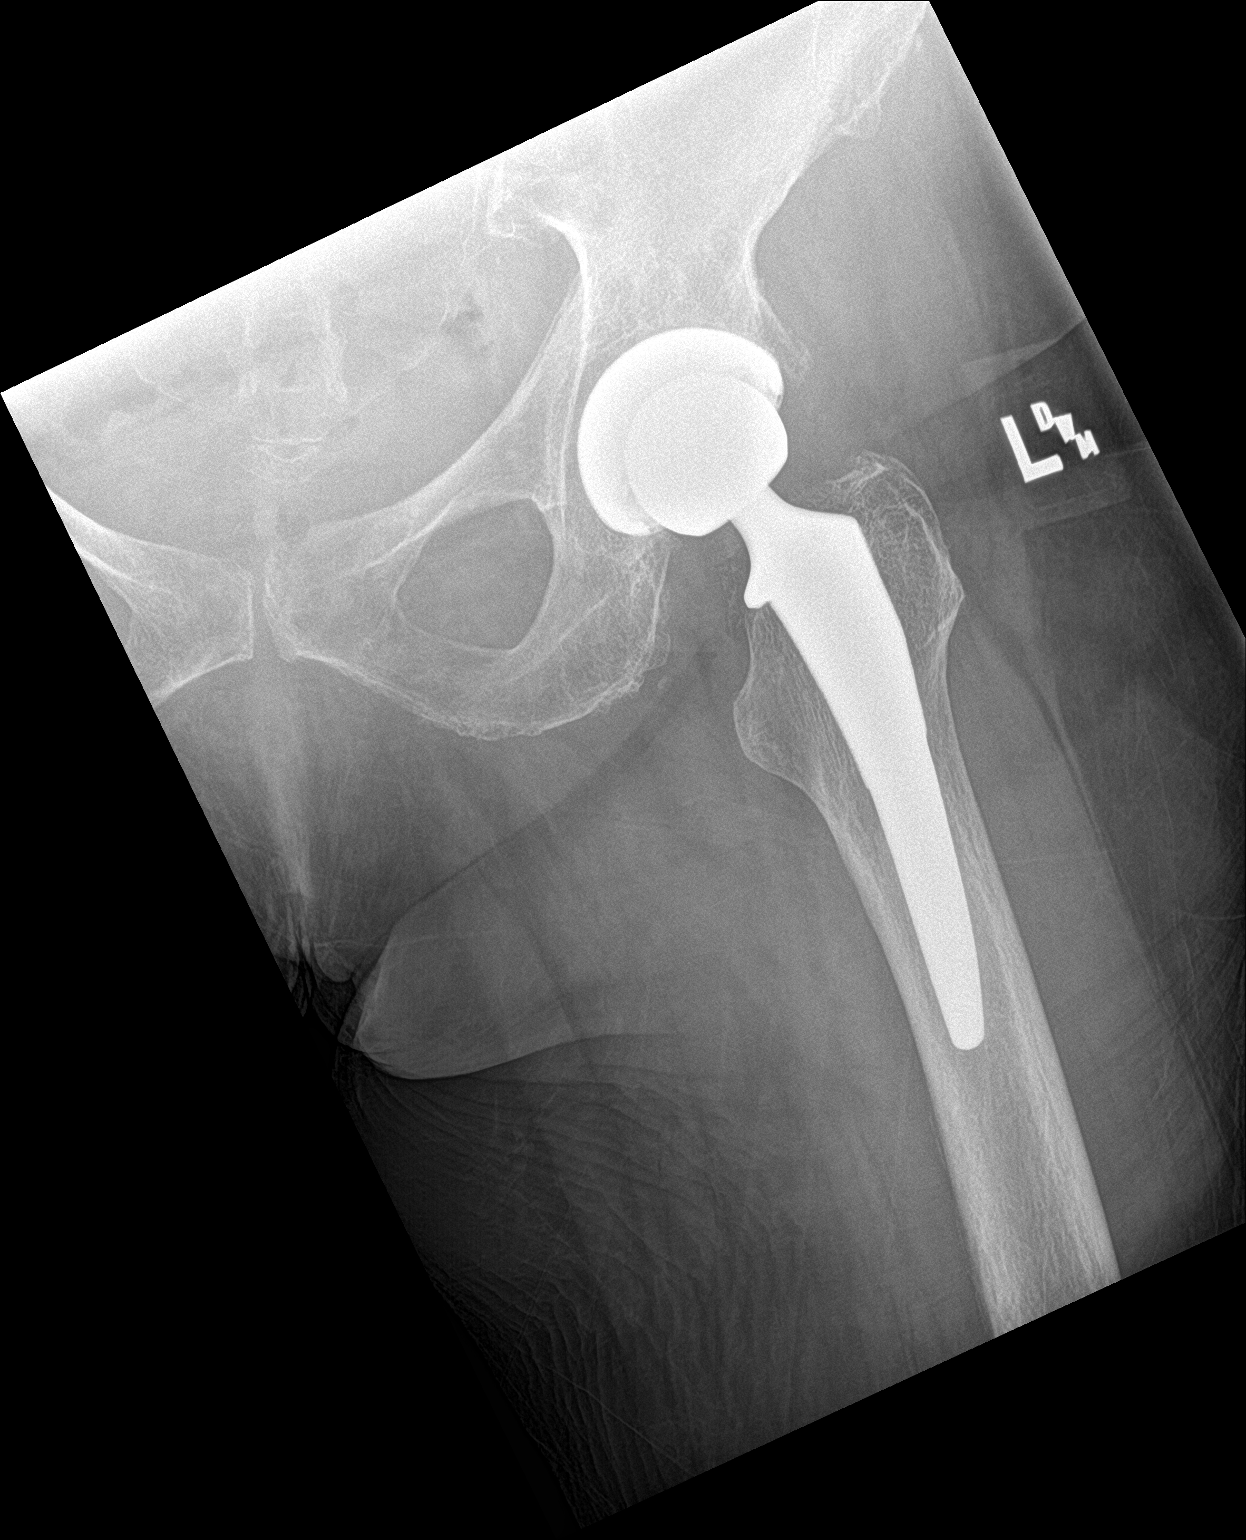

[3 of 3 positions shown; findings below may reference images not displayed]

FINDINGS: Left hip prosthesis is noted and well seated. No acute fracture or
dislocation is noted. No soft tissue abnormality is seen.
IMPRESSION: Status post left hip prosthesis.  No acute abnormality noted.

## 2023-04-28 DEATH — deceased
